# Patient Record
Sex: Female | Born: 1960 | Race: White | Hispanic: No | Marital: Married | Smoking: Never smoker
Health system: Southern US, Community
[De-identification: ages and names within clinical notes are randomized; demographics above are authoritative.]

---

## 1997-09-16 ENCOUNTER — Other Ambulatory Visit: Admission: RE | Admit: 1997-09-16 | Discharge: 1997-09-16 | Payer: Self-pay | Admitting: Obstetrics and Gynecology

## 2001-10-11 ENCOUNTER — Other Ambulatory Visit: Admission: RE | Admit: 2001-10-11 | Discharge: 2001-10-11 | Payer: Self-pay | Admitting: Family Medicine

## 2005-12-28 ENCOUNTER — Other Ambulatory Visit: Admission: RE | Admit: 2005-12-28 | Discharge: 2005-12-28 | Payer: Self-pay | Admitting: Family Medicine

## 2007-09-09 ENCOUNTER — Emergency Department (HOSPITAL_COMMUNITY): Admission: EM | Admit: 2007-09-09 | Discharge: 2007-09-10 | Payer: Self-pay | Admitting: Emergency Medicine

## 2011-01-24 ENCOUNTER — Inpatient Hospital Stay (INDEPENDENT_AMBULATORY_CARE_PROVIDER_SITE_OTHER)
Admission: RE | Admit: 2011-01-24 | Discharge: 2011-01-24 | Disposition: A | Discharge status: Home / Self Care | Payer: BC Managed Care – PPO | Source: Ambulatory Visit | Attending: Family Medicine | Admitting: Family Medicine

## 2011-01-24 ENCOUNTER — Encounter: Payer: Self-pay | Admitting: Family Medicine

## 2011-01-24 ENCOUNTER — Other Ambulatory Visit: Payer: Self-pay | Admitting: Family Medicine

## 2011-01-24 ENCOUNTER — Ambulatory Visit
Admission: RE | Admit: 2011-01-24 | Discharge: 2011-01-24 | Disposition: A | Discharge status: Home / Self Care | Payer: BC Managed Care – PPO | Source: Ambulatory Visit | Attending: Family Medicine | Admitting: Family Medicine

## 2011-01-24 DIAGNOSIS — M940 Chondrocostal junction syndrome [Tietze]: Secondary | ICD-10-CM | POA: Insufficient documentation

## 2011-01-24 DIAGNOSIS — R079 Chest pain, unspecified: Secondary | ICD-10-CM

## 2011-01-24 DIAGNOSIS — I498 Other specified cardiac arrhythmias: Secondary | ICD-10-CM

## 2011-01-24 DIAGNOSIS — J45909 Unspecified asthma, uncomplicated: Secondary | ICD-10-CM | POA: Insufficient documentation

## 2011-01-24 DIAGNOSIS — J309 Allergic rhinitis, unspecified: Secondary | ICD-10-CM | POA: Insufficient documentation

## 2011-01-26 ENCOUNTER — Telehealth (INDEPENDENT_AMBULATORY_CARE_PROVIDER_SITE_OTHER): Payer: Self-pay | Admitting: Emergency Medicine

## 2011-05-24 NOTE — Telephone Encounter (Signed)
  Phone Note Outgoing Call Call back at [phone]   Call placed by: Emilio Math,  January 26, 2011 3:36 PM Call placed to: Patient Summary of Call: Left msg hope she is feeling better, call with concerns.

## 2011-05-24 NOTE — Progress Notes (Signed)
Summary: BACK/CHEST TIGHTNESS (room 5)   Vital Signs:  Patient Profile:   50 Years Old Female CC:      chest wall and back pain x 24 hours Height:     66 inches Weight:      175 pounds O2 Sat:      97 % O2 treatment:    Room Air Temp:     98 degrees F oral Pulse rate:   66 / minute Resp:     16 per minute BP sitting:   130 / 91  (left arm) Cuff size:   regular  Vitals Entered By: Lavell Islam RN (January 24, 2011 5:17 PM)                  Prior Medication List:  No prior medications documented  Current Allergies (reviewed today): ! * ENVIRONMENTALHistory of Present Illness Chief Complaint: chest wall and back pain x 24 hours History of Present Illness: Patient started having pain over both ribs and anterior chest wall . Now she is having some pain in her neck and back as well.  Current Problems: BRADYCARDIA (ICD-427.89) COSTOCHONDRITIS, ACUTE (ICD-733.6) CHEST PAIN, ACUTE (ICD-786.50) FAMILY HISTORY DIABETES 1ST DEGREE RELATIVE (ICD-V18.0) FAMILY HISTORY OF CAD FEMALE 1ST DEGREE RELATIVE <50 (ICD-V17.3) ASTHMA (ICD-493.90) ALLERGIC RHINITIS (ICD-477.9)   Current Meds FLOVENT HFA 44 MCG/ACT AERO (FLUTICASONE PROPIONATE  HFA) as needed MOBIC 7.5 MG TABS (MELOXICAM) 1 by mouth q day TRAMADOL HCL 50 MG  TABS (TRAMADOL HCL) 1 by mouth q 8hrs as needed for pain  REVIEW OF SYSTEMS Constitutional Symptoms      Denies fever, chills, night sweats, weight loss, weight gain, and fatigue.  Eyes       Denies change in vision, eye pain, eye discharge, glasses, contact lenses, and eye surgery. Ear/Nose/Throat/Mouth       Denies hearing loss/aids, change in hearing, ear pain, ear discharge, dizziness, frequent runny nose, frequent nose bleeds, sinus problems, sore throat, hoarseness, and tooth pain or bleeding.  Respiratory       Complains of dry cough, shortness of breath, and asthma.      Denies productive cough, wheezing, bronchitis, and emphysema/COPD.  Cardiovascular  Complains of chest pain.      Denies murmurs and tires easily with exhertion.    Gastrointestinal       Complains of nausea/vomiting.      Denies stomach pain, diarrhea, constipation, blood in bowel movements, and indigestion. Genitourniary       Denies painful urination, kidney stones, and loss of urinary control. Neurological       Complains of headaches.      Denies paralysis, seizures, and fainting/blackouts. Musculoskeletal       Denies muscle pain, joint pain, joint stiffness, decreased range of motion, redness, swelling, muscle weakness, and gout.  Skin       Denies bruising, unusual mles/lumps or sores, and hair/skin or nail changes.  Psych       Denies mood changes, temper/anger issues, anxiety/stress, speech problems, depression, and sleep problems. Other Comments: chest wall pain upon inspiration x 24 hours    Past History:  Family History: Last updated: 01/24/2011 Family History of CAD Female 1st degree relative <50 Family History Diabetes 1st degree relative  Social History: Last updated: 01/24/2011 Never Smoked Alcohol use-yes Drug use-no Marital Status: Married Children:  Occupation:   Risk Factors: Smoking Status: never (01/24/2011)  Past Medical History: Allergic rhinitis Asthma  Past Surgical History: breast implants  Family History: Reviewed  history and no changes required. Family History of CAD Female 1st degree relative <50 Family History Diabetes 1st degree relative  Social History: Reviewed history and no changes required. Never Smoked Alcohol use-yes Drug use-no Marital Status: Married Children:  Occupation:  Smoking Status:  never Drug Use:  no Physical Exam General appearance: well developed, well nourished, no acute distress Head: normocephalic, atraumatic Oral/Pharynx: tongue normal, posterior pharynx without erythema or exudate Neck: neck supple,  trachea midline, no masses Chest/Lungs: no rales, wheezes, or rhonchi bilateral,  breath sounds equal without effort marked tenderness over anterior chest wall and both ribs Heart: regular rate and  rhythm, no murmur Skin: no obvious rashes or lesions MSE: oriented to time, place, and person Assessment Problems:   New Problems: BRADYCARDIA (ICD-427.89) COSTOCHONDRITIS, ACUTE (ICD-733.6) CHEST PAIN, ACUTE (ICD-786.50) FAMILY HISTORY DIABETES 1ST DEGREE RELATIVE (ICD-V18.0) FAMILY HISTORY OF CAD FEMALE 1ST DEGREE RELATIVE <50 (ICD-V17.3) ASTHMA (ICD-493.90) ALLERGIC RHINITIS (ICD-477.9)   Patient Education: Patient and/or caregiver instructed in the following: rest, fluids.  Plan New Medications/Changes: TRAMADOL HCL 50 MG  TABS (TRAMADOL HCL) 1 by mouth q 8hrs as needed for pain  #20 x 0, 01/24/2011, Hassan Rowan MD MOBIC 7.5 MG TABS (MELOXICAM) 1 by mouth q day  #30 x 0, 01/24/2011, Hassan Rowan MD  New Orders: New Patient Level III [phone] T-Chest x-ray, 2 views [71020] EKG w/ Interpretation [93000] Ketorolac-Toradol 15mg  [J1885] Admin of Therapeutic Inj  intramuscular or subcutaneous [96372] Planning Comments:   as below  Follow Up: Follow up in 2-3 days if no improvement, Follow up on an as needed basis, Follow up with Primary Physician  The patient and/or caregiver has been counseled thoroughly with regard to medications prescribed including dosage, schedule, interactions, rationale for use, and possible side effects and they verbalize understanding.  Diagnoses and expected course of recovery discussed and will return if not improved as expected or if the condition worsens. Patient and/or caregiver verbalized understanding.   PROCEDURE: Follow up: Patient w/improved symptoms after the toradol injection. Prescriptions: TRAMADOL HCL 50 MG  TABS (TRAMADOL HCL) 1 by mouth q 8hrs as needed for pain  #20 x 0   Entered and Authorized by:   Hassan Rowan MD   Signed by:   Hassan Rowan MD on 01/24/2011   Method used:   Print then Give to Patient   RxID:    [id number] MOBIC 7.5 MG TABS (MELOXICAM) 1 by mouth q day  #30 x 0   Entered and Authorized by:   Hassan Rowan MD   Signed by:   Hassan Rowan MD on 01/24/2011   Method used:   Print then Give to Patient   RxID:   [id number]   Patient Instructions: 1)  Please schedule a follow-up appointment as needed. 2)  Please schedule an appointment with your primary doctor in :3-14 days if discomfort continues or gets worse  Medication Administration  Injection # 1:    Medication: Ketorolac-Toradol 15mg     Diagnosis: COSTOCHONDRITIS, ACUTE (ICD-733.6)    Route: IM    Site: LUOQ gluteus    Exp Date: 08/2012    Lot #: [id number]    Mfr: novaplus    Comments: 60 mg per Dr.Caliyah Sieh's order    Patient tolerated injection without complications    Given by: Lavell Islam RN (January 24, 2011 5:42 PM)  Orders Added: 1)  New Patient Level III [99203] 2)  T-Chest x-ray, 2 views [71020] 3)  EKG w/ Interpretation [93000] 4)  Ketorolac-Toradol 15mg  [L2440]  5)  Admin of Therapeutic Inj  intramuscular or subcutaneous [40981]

## 2011-07-20 ENCOUNTER — Emergency Department (HOSPITAL_COMMUNITY): Payer: No Typology Code available for payment source

## 2011-07-20 ENCOUNTER — Other Ambulatory Visit: Payer: Self-pay

## 2011-07-20 ENCOUNTER — Encounter (HOSPITAL_COMMUNITY): Payer: Self-pay

## 2011-07-20 ENCOUNTER — Emergency Department (HOSPITAL_COMMUNITY)
Admission: EM | Admit: 2011-07-20 | Discharge: 2011-07-20 | Disposition: A | Discharge status: Home / Self Care | Payer: No Typology Code available for payment source | Attending: Emergency Medicine | Admitting: Emergency Medicine

## 2011-07-20 DIAGNOSIS — M25569 Pain in unspecified knee: Secondary | ICD-10-CM | POA: Insufficient documentation

## 2011-07-20 DIAGNOSIS — S8000XA Contusion of unspecified knee, initial encounter: Secondary | ICD-10-CM | POA: Insufficient documentation

## 2011-07-20 DIAGNOSIS — H538 Other visual disturbances: Secondary | ICD-10-CM | POA: Insufficient documentation

## 2011-07-20 DIAGNOSIS — R079 Chest pain, unspecified: Secondary | ICD-10-CM | POA: Insufficient documentation

## 2011-07-20 DIAGNOSIS — M542 Cervicalgia: Secondary | ICD-10-CM | POA: Insufficient documentation

## 2011-07-20 DIAGNOSIS — S8001XA Contusion of right knee, initial encounter: Secondary | ICD-10-CM

## 2011-07-20 DIAGNOSIS — M545 Low back pain, unspecified: Secondary | ICD-10-CM | POA: Insufficient documentation

## 2011-07-20 DIAGNOSIS — M546 Pain in thoracic spine: Secondary | ICD-10-CM | POA: Insufficient documentation

## 2011-07-20 DIAGNOSIS — M549 Dorsalgia, unspecified: Secondary | ICD-10-CM

## 2011-07-20 DIAGNOSIS — M25469 Effusion, unspecified knee: Secondary | ICD-10-CM | POA: Insufficient documentation

## 2011-07-20 DIAGNOSIS — J45909 Unspecified asthma, uncomplicated: Secondary | ICD-10-CM | POA: Insufficient documentation

## 2011-07-20 DIAGNOSIS — S20219A Contusion of unspecified front wall of thorax, initial encounter: Secondary | ICD-10-CM

## 2011-07-20 LAB — POCT I-STAT, CHEM 8
Creatinine, Ser: 0.9 mg/dL (ref 0.50–1.10)
Glucose, Bld: 98 mg/dL (ref 70–99)
Hemoglobin: 15.6 g/dL — ABNORMAL HIGH (ref 12.0–15.0)
Potassium: 4.1 mEq/L (ref 3.5–5.1)

## 2011-07-20 MED ORDER — IOHEXOL 300 MG/ML  SOLN
80.0000 mL | Freq: Once | INTRAMUSCULAR | Status: AC | PRN
  Administered 2011-07-20: 80 mL via INTRAVENOUS

## 2011-07-20 MED ORDER — ONDANSETRON HCL 4 MG/2ML IJ SOLN
4.0000 mg | Freq: Once | INTRAMUSCULAR | Status: AC
  Administered 2011-07-20: 4 mg via INTRAVENOUS
  Filled 2011-07-20: qty 2

## 2011-07-20 MED ORDER — SODIUM CHLORIDE 0.9 % IV SOLN
INTRAVENOUS | Status: DC
  Administered 2011-07-20: 100 mL/h via INTRAVENOUS

## 2011-07-20 MED ORDER — MORPHINE SULFATE 4 MG/ML IJ SOLN
4.0000 mg | Freq: Once | INTRAMUSCULAR | Status: AC
  Administered 2011-07-20: 4 mg via INTRAVENOUS
  Filled 2011-07-20: qty 1

## 2011-07-20 MED ORDER — CYCLOBENZAPRINE HCL 10 MG PO TABS: 10.0000 mg | ORAL_TABLET | Freq: Three times a day (TID) | ORAL | Status: AC | PRN

## 2011-07-20 MED ORDER — TRAMADOL-ACETAMINOPHEN 37.5-325 MG PO TABS: ORAL_TABLET | ORAL | Status: AC
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
## 2011-07-20 NOTE — ED Notes (Signed)
Gave ECG to Dr. Andreas Ohm after I performed. 11:31 am JG.
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
## 2011-07-20 NOTE — ED Notes (Signed)
Patient returned from XRAY 

## 2015-09-23 ENCOUNTER — Encounter: Payer: Self-pay | Admitting: *Deleted

## 2015-09-23 ENCOUNTER — Emergency Department (INDEPENDENT_AMBULATORY_CARE_PROVIDER_SITE_OTHER): Payer: BLUE CROSS/BLUE SHIELD

## 2015-09-23 ENCOUNTER — Emergency Department (INDEPENDENT_AMBULATORY_CARE_PROVIDER_SITE_OTHER)
Admission: EM | Admit: 2015-09-23 | Discharge: 2015-09-23 | Disposition: A | Discharge status: Home / Self Care | Payer: BLUE CROSS/BLUE SHIELD | Source: Home / Self Care | Attending: Family Medicine | Admitting: Family Medicine

## 2015-09-23 DIAGNOSIS — M549 Dorsalgia, unspecified: Secondary | ICD-10-CM | POA: Diagnosis not present

## 2015-09-23 DIAGNOSIS — M546 Pain in thoracic spine: Secondary | ICD-10-CM | POA: Diagnosis not present

## 2015-09-23 DIAGNOSIS — M62830 Muscle spasm of back: Secondary | ICD-10-CM

## 2015-09-23 MED ORDER — METHOCARBAMOL 500 MG PO TABS: 500.0000 mg | ORAL_TABLET | Freq: Two times a day (BID) | ORAL | Status: AC

## 2015-09-23 MED ORDER — MELOXICAM 7.5 MG PO TABS: 7.5000 mg | ORAL_TABLET | Freq: Every day | ORAL | Status: AC

## 2015-09-23 NOTE — Discharge Instructions (Signed)
Robaxin is a muscle relaxer and may cause drowsiness. Do not drink alcohol, drive, or operate heavy machinery while taking.  Meloxicam (Mobic) is an antiinflammatory to help with pain and inflammation.  Do not take ibuprofen, Advil, Aleve, or any other medications that contain NSAIDs while taking meloxicam as this may cause stomach upset or even ulcers if taken in large amounts for an extended period of time.

## 2015-09-23 NOTE — ED Provider Notes (Signed)
CSN: [id number]     Arrival date & time 09/23/15  1412 History   First MD Initiated Contact with Patient 09/23/15 1444     Chief Complaint  Patient presents with  . Shoulder Pain   (Consider location/radiation/quality/duration/timing/severity/associated sxs/prior Treatment) HPI The pt is a 55yo female [facility] with c/o Left upper back pain described as and aching, sore, occasionally sharp pain behind her shoulder blade.  Pain is minimal at this time but has gradually worsened over the last 1-2 months. Worse with deep breathing.  Pt was seen 4 days ago in emergency department for elevated blood pressure and notes she had a D-dimer performed, which was negative.  Pt denies hx of blood clots, leg pain or swelling, long travel or recent surgery.  She does do yoga often but stopped the last 2 weeks thinking she pulled a muscle that just needed rest. No improvement with rest. She has not been taking any OTC medication for the pain/soreness. Denies anterior chest pain or SOB.  Past Medical History  Diagnosis Date  . Asthma    History reviewed. No pertinent past surgical history. History reviewed. No pertinent family history. Social History  Substance Use Topics  . Smoking status: Never Smoker   . Smokeless tobacco: None  . Alcohol Use: Yes   OB History    No data available     Review of Systems  Constitutional: Negative for fever, chills and fatigue.  Respiratory: Positive for chest tightness. Negative for cough and shortness of breath.   Cardiovascular: Negative for chest pain and palpitations.  Musculoskeletal: Positive for myalgias and back pain (Left upper). Negative for arthralgias.    Allergies  Sulfur  Home Medications   Prior to Admission medications   Medication Sig Start Date End Date Taking? Authorizing Provider  Cyanocobalamin (B-12 PO) Take by mouth.   Yes Historical Provider, MD  meloxicam (MOBIC) 7.5 MG tablet Take 1 tablet (7.5 mg total) by mouth daily.  09/23/15   Junius FinnerErin O'Malley, PA-C  methocarbamol (ROBAXIN) 500 MG tablet Take 1 tablet (500 mg total) by mouth 2 (two) times daily. 09/23/15   Junius FinnerErin O'Malley, PA-C   Meds Ordered and Administered this Visit  Medications - No data to display  BP 123/87 mmHg  Pulse 68  Temp(Src) 98.5 F (36.9 C) (Oral)  Ht 5\' 6"  (1.676 m)  Wt 144 lb (65.318 kg)  BMI 23.25 kg/m2  SpO2 100% No data found.   Physical Exam  Constitutional: She appears well-developed and well-nourished. No distress.  HENT:  Head: Normocephalic and atraumatic.  Eyes: Conjunctivae are normal. No scleral icterus.  Neck: Normal range of motion. Neck supple.  Cardiovascular: Normal rate, regular rhythm and normal heart sounds.   Pulmonary/Chest: Effort normal and breath sounds normal. No stridor. No respiratory distress. She has no wheezes. She has no rales. She exhibits no tenderness.  Abdominal: Soft. She exhibits no distension. There is no tenderness.  Musculoskeletal: Normal range of motion. She exhibits tenderness. She exhibits no edema.  No midline spinal tenderness.  Full ROM Left shoulder with 5/5 strength in Left UE compared to Right. Palpable small muscle spasm over Left rhomboid muscles.  Reproduces described pain.   Lymphadenopathy:    She has no cervical adenopathy.  Neurological: She is alert.  Skin: Skin is warm and dry. She is not diaphoretic.  Nursing note and vitals reviewed.   ED Course  Procedures (including critical care time)  Labs Review Labs Reviewed - No data to display  Imaging Review Dg Chest 2 View  09/23/2015  CLINICAL DATA:  Upper back pain EXAM: CHEST  2 VIEW COMPARISON:  07/20/2011 FINDINGS: Normal heart size and mediastinal contours. No acute infiltrate or edema. No effusion or pneumothorax. Mild thoracic spondylosis, similar to 2013 radiography. No acute osseous findings. IMPRESSION: Stable.  No evidence of active disease. Electronically Signed   By: Marnee Spring M.D.   On: 09/23/2015  14:55      MDM   1. Upper back pain on left side   2. Muscle spasm of back     Pt c/o mild chest tightness with Left upper back pain. Pain reproducible with palpation.  Negative D-dimer per pt 4 days ago. No SOB at this time. O2 Sat 100% on RA. Doubt PE.  CXR: normal exam. No evidence of infiltrate, edema, or effusion.  Pain likely due to muscle spasm. Rx: Robaxin and Meloxicam  Home care instructions provided. F/u with [facility] in 1-2 weeks if not improving, sooner if worsening. Patient verbalized understanding and agreement with treatment plan.    Junius Finner, PA-C 09/23/15 1513

## 2015-09-23 NOTE — ED Notes (Addendum)
Pt c/o 2 months of left scapular pain when taking deep breaths. No injury, no recent illness. Used hot/cold compress. Chest heaviness more recently.

## 2015-09-26 ENCOUNTER — Telehealth: Payer: Self-pay | Admitting: *Deleted

## 2017-08-02 IMAGING — CR DG CHEST 2V
2 series · 2 of 2 positions shown · non-contrast
Comparison: 07/20/2011

CLINICAL DATA: Upper back pain

EXAM:
CHEST  2 VIEW

[chest pa]
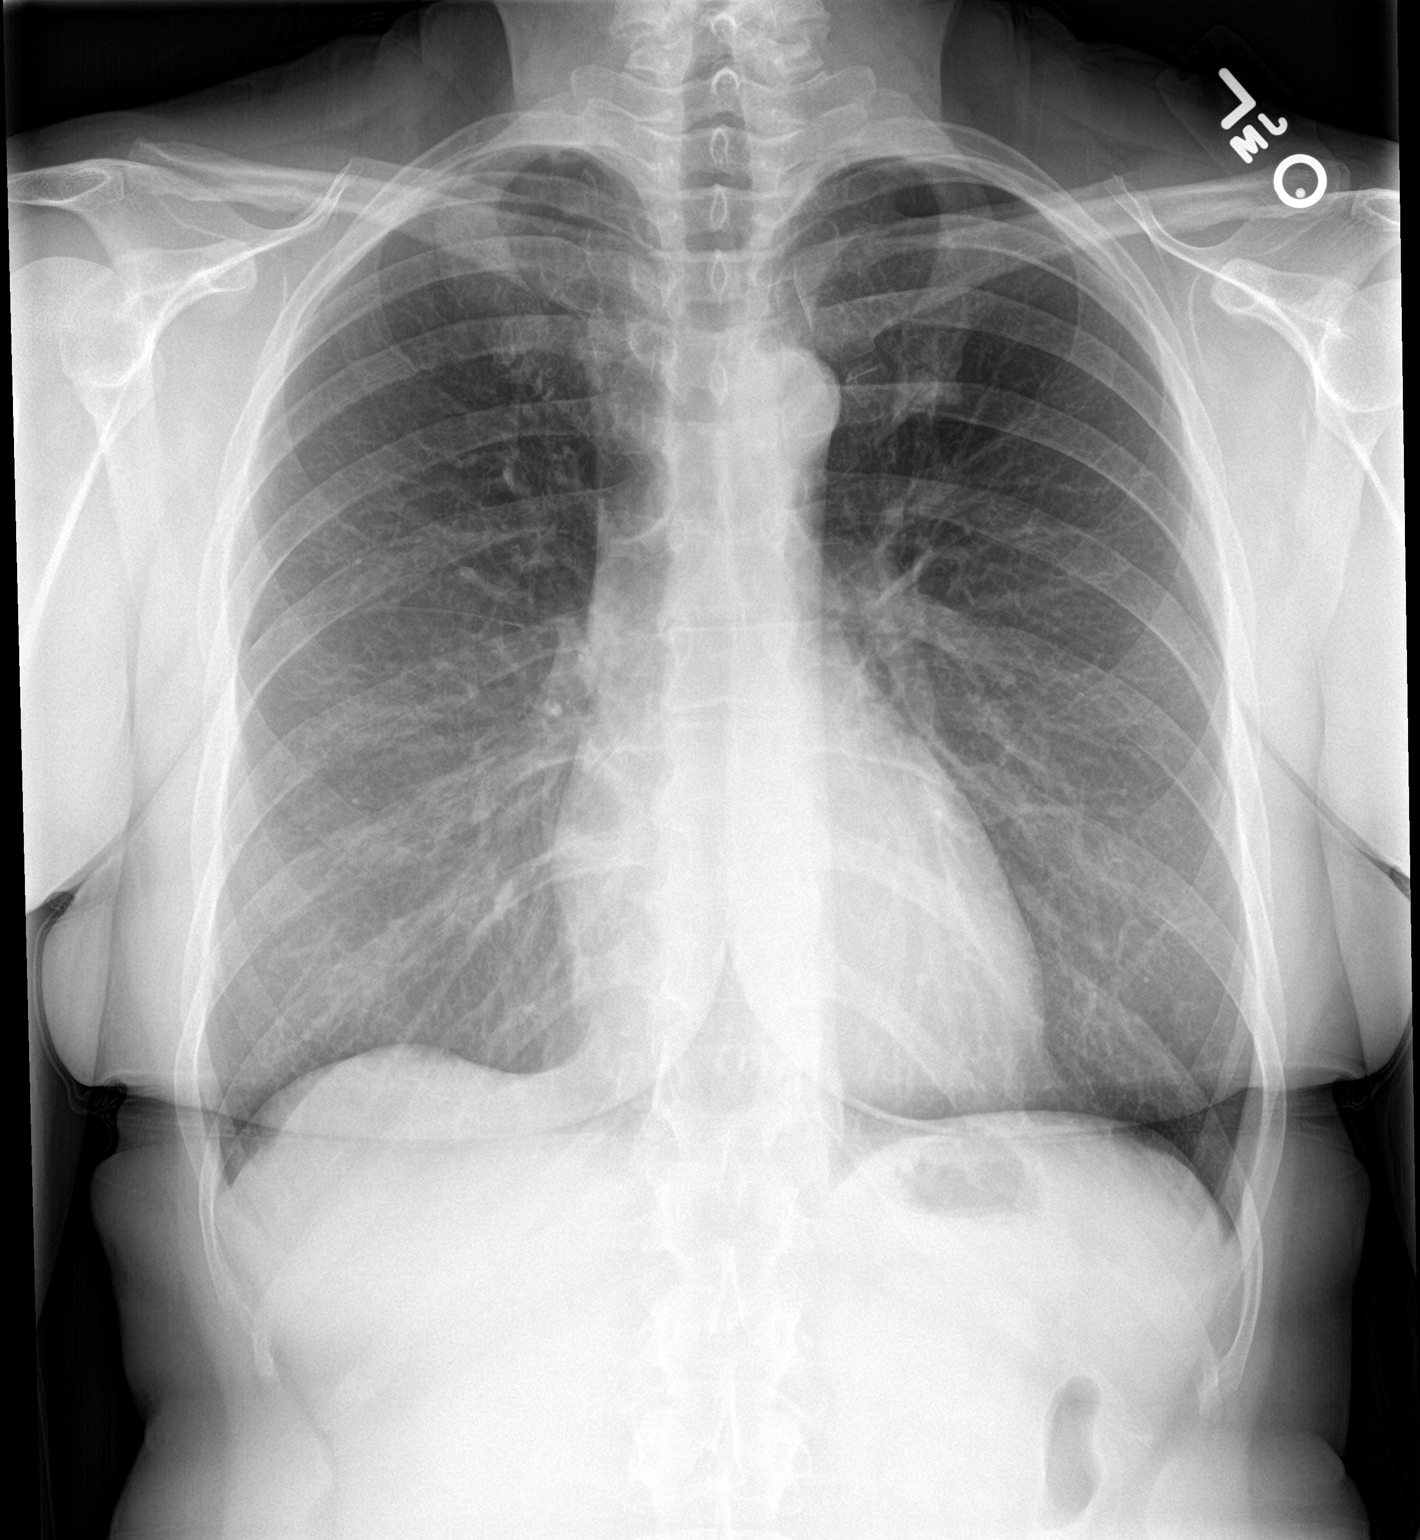

[chest lat]
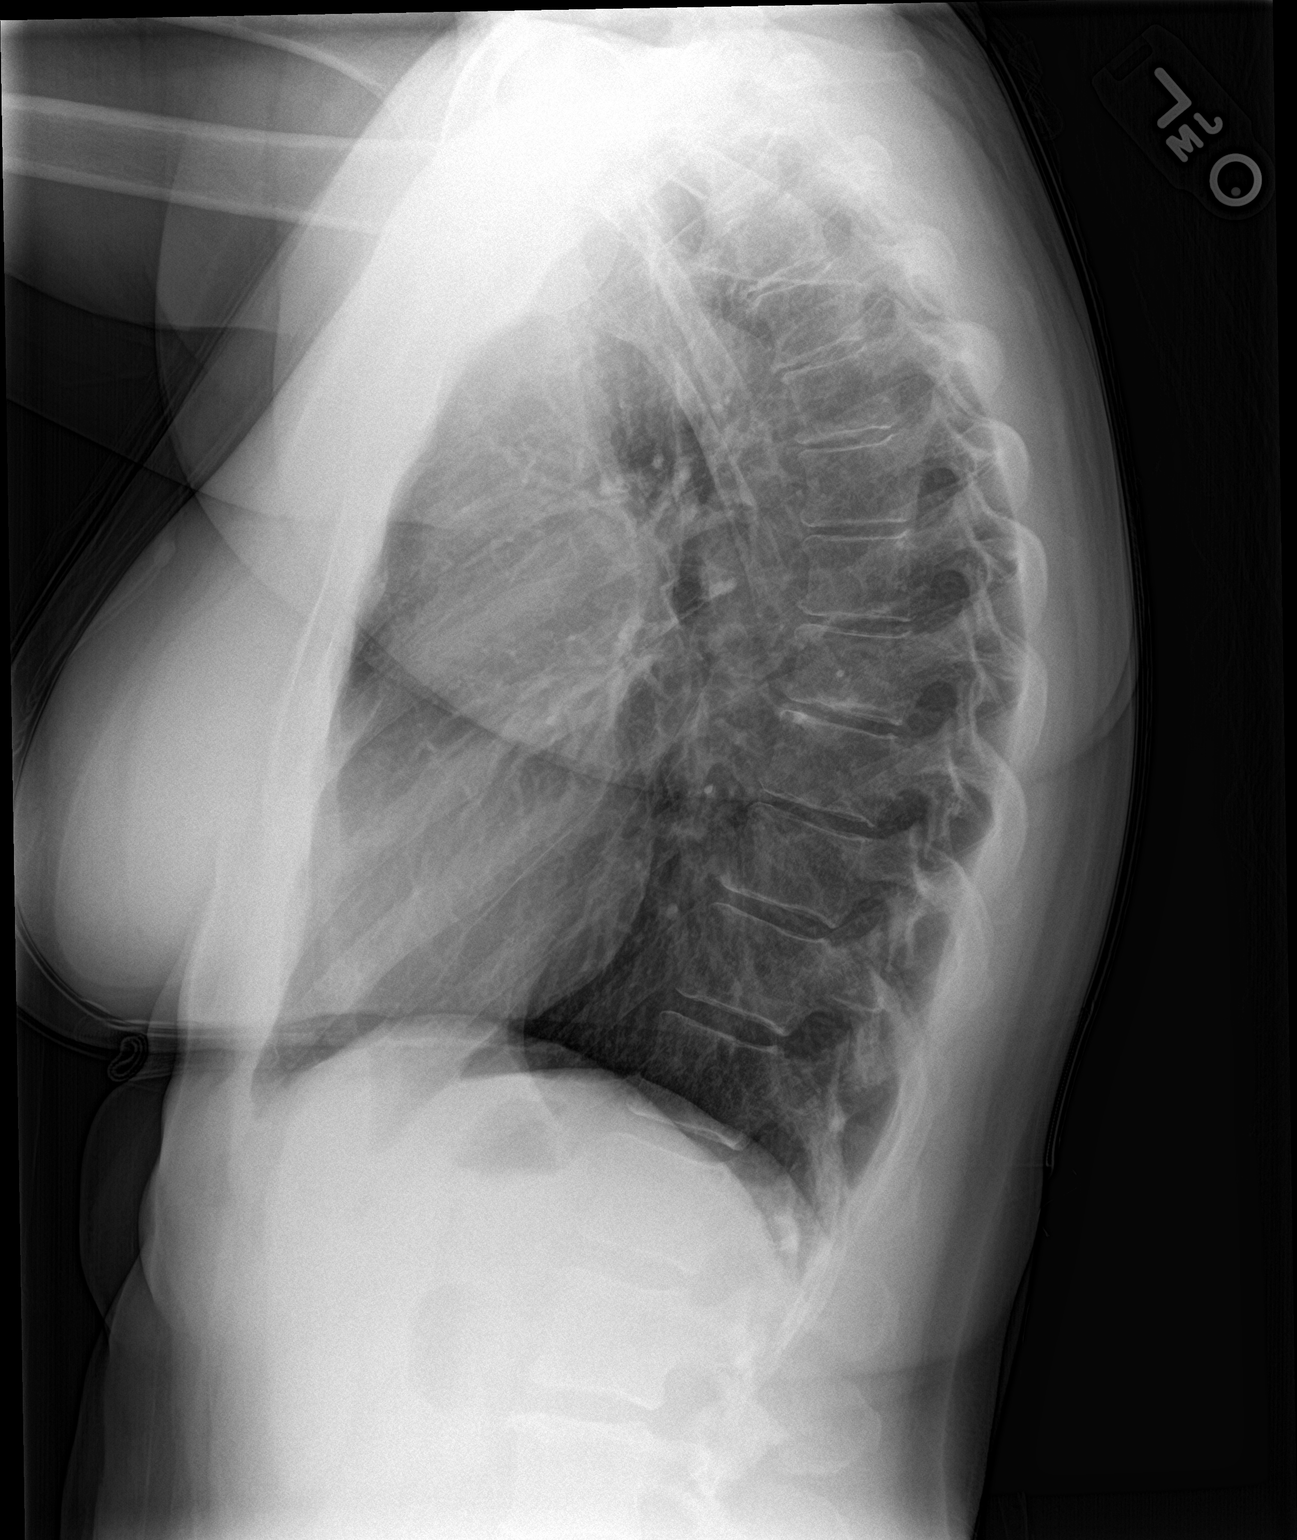

[2 of 2 positions shown; findings below may reference images not displayed]

FINDINGS: Normal heart size and mediastinal contours. No acute infiltrate or
edema. No effusion or pneumothorax. Mild thoracic spondylosis,
similar to 3659 radiography. No acute osseous findings.
IMPRESSION: Stable.  No evidence of active disease.

## 2020-07-24 ENCOUNTER — Telehealth: Payer: BLUE CROSS/BLUE SHIELD | Admitting: Orthopedic Surgery

## 2020-07-24 DIAGNOSIS — U071 COVID-19: Secondary | ICD-10-CM

## 2020-07-24 MED ORDER — BENZONATATE 100 MG PO CAPS: 100.0000 mg | ORAL_CAPSULE | Freq: Three times a day (TID) | ORAL | 0 refills | Status: AC | PRN

## 2020-07-24 MED ORDER — ALBUTEROL SULFATE HFA 108 (90 BASE) MCG/ACT IN AERS: 2.0000 | INHALATION_SPRAY | Freq: Four times a day (QID) | RESPIRATORY_TRACT | 0 refills | Status: AC | PRN

## 2020-07-24 NOTE — Progress Notes (Signed)
E-Visit for Corona Virus Screening  We are sorry you are not feeling well. We are here to help!  You have tested positive for COVID-19, meaning that you were infected with the novel coronavirus and could give the virus to others.  It is vitally important that you stay home so you do not spread it to others.      Please continue isolation at home, for at least 10 days since the start of your symptoms and until you have had 24 hours with no fever (without taking a fever reducer) and with improving of symptoms.  If you have no symptoms but tested positive (or all symptoms resolve after 5 days and you have no fever) you can leave your house but continue to wear a mask around others for an additional 5 days. If you have a fever,continue to stay home until you have had 24 hours of no fever. Most cases improve 5-10 days from onset but we have seen a small number of patients who have gotten worse after the 10 days.  Please be sure to watch for worsening symptoms and remain taking the proper precautions.   Go to the nearest hospital ED for assessment if fever/cough/breathlessness are severe or illness seems like a threat to life.    The following symptoms may appear 2-14 days after exposure: . Fever . Cough . Shortness of breath or difficulty breathing . Chills . Repeated shaking with chills . Muscle pain . Headache . Sore throat . New loss of taste or smell . Fatigue . Congestion or runny nose . Nausea or vomiting . Diarrhea  You have been enrolled in [facility] Monitoring for COVID-19. Daily you will receive a questionnaire within the MyChart website. Our COVID-19 response team will be monitoring your responses daily.  You can use medication such as A prescription cough medication called Tessalon Perles 100 mg. You may take 1-2 capsules every 8 hours as needed for cough, A prescription inhaler called Albuterol MDI 90 mcg /actuation 2 puffs every 4 hours as needed for shortness of breath,  wheezing, cough.   We have asked the monoclonal antibody team contact you to discuss the infusion with you and potentially set this up. You should hear from them in the next 48 hours.    You may also take acetaminophen (Tylenol) as needed for fever.  HOME CARE: . Only take medications as instructed by your medical team. . Drink plenty of fluids and get plenty of rest. . A steam or ultrasonic humidifier can help if you have congestion.   GET HELP RIGHT AWAY IF YOU HAVE EMERGENCY WARNING SIGNS.  Call 911 or proceed to your closest emergency facility if: . You develop worsening high fever. . Trouble breathing . Bluish lips or face . Persistent pain or pressure in the chest . New confusion . Inability to wake or stay awake . You cough up blood. . Your symptoms become more severe . Inability to hold down food or fluids  This list is not all possible symptoms. Contact your medical provider for any symptoms that are severe or concerning to you.    Your e-visit answers were reviewed by a board certified advanced clinical practitioner to complete your personal care plan.  Depending on the condition, your plan could have included both over the counter or prescription medications.  If there is a problem please reply once you have received a response from your provider.  Your safety is important to [place].  If you have drug  allergies check your prescription carefully.    You can use MyChart to ask questions about today's visit, request a non-urgent call back, or ask for a work or school excuse for 24 hours related to this e-Visit. If it has been greater than 24 hours you will need to follow up with your provider, or enter a new e-Visit to address those concerns. You will get an e-mail in the next two days asking about your experience.  I hope that your e-visit has been valuable and will speed your recovery. Thank you for using e-visits.   Greater than 5 minutes, yet less than 10 minutes of time  have been spent researching, coordinating and implementing care for this patient today.

## 2020-07-25 ENCOUNTER — Telehealth: Payer: Self-pay

## 2020-07-25 NOTE — Telephone Encounter (Signed)
Attempted to contact patient bu no answer. VM left and mychart message will be sent regarding questionnaire.

## 2020-08-06 ENCOUNTER — Telehealth: Payer: Self-pay | Admitting: *Deleted

## 2020-08-06 NOTE — Telephone Encounter (Signed)
Attempted to call patient to review worsening covid symptoms. No answer, left message on voicemail to call back at #[phone].

## 2020-08-06 NOTE — Telephone Encounter (Signed)
Called patient to review covid questionnaire responses to worsening symptoms. No answer, left message to call back and patient called back. Reviewed worsening symptom responses with patient. Patent reports she is continuing to take prescribed medication from PCP and she needs to hydrate more. Patient verbalized understanding of care advise as documented in patient questionnaire. Patient verbalized if symptoms worsen to seek treatment in the ED.
